# Patient Record
Sex: Female | Born: 1995 | Race: Black or African American | Hispanic: No | Marital: Single | Smoking: Former smoker
Health system: Southern US, Community
[De-identification: ages and names within clinical notes are randomized; demographics above are authoritative.]

## PROBLEM LIST (undated history)

## (undated) DIAGNOSIS — F419 Anxiety disorder, unspecified: Secondary | ICD-10-CM

## (undated) DIAGNOSIS — F410 Panic disorder [episodic paroxysmal anxiety] without agoraphobia: Secondary | ICD-10-CM

---
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
## 2017-12-05 ENCOUNTER — Encounter (HOSPITAL_COMMUNITY): Payer: Self-pay

## 2017-12-05 ENCOUNTER — Emergency Department (HOSPITAL_COMMUNITY)
Admission: EM | Admit: 2017-12-05 | Discharge: 2017-12-05 | Disposition: A | Discharge status: Home / Self Care | Payer: Self-pay | Attending: Emergency Medicine | Admitting: Emergency Medicine

## 2017-12-05 ENCOUNTER — Other Ambulatory Visit: Payer: Self-pay

## 2017-12-05 DIAGNOSIS — B349 Viral infection, unspecified: Secondary | ICD-10-CM | POA: Insufficient documentation

## 2017-12-05 DIAGNOSIS — F17210 Nicotine dependence, cigarettes, uncomplicated: Secondary | ICD-10-CM | POA: Insufficient documentation

## 2017-12-05 LAB — COMPREHENSIVE METABOLIC PANEL
ALT: 20 U/L (ref 0–44)
AST: 19 U/L (ref 15–41)
Albumin: 4.7 g/dL (ref 3.5–5.0)
Alkaline Phosphatase: 66 U/L (ref 38–126)
Anion gap: 10 (ref 5–15)
BUN: 9 mg/dL (ref 6–20)
CO2: 26 mmol/L (ref 22–32)
CREATININE: 0.66 mg/dL (ref 0.44–1.00)
Calcium: 9.5 mg/dL (ref 8.9–10.3)
Chloride: 104 mmol/L (ref 98–111)
GFR calc non Af Amer: >60 mL/min (ref >60)
Glucose, Bld: 96 mg/dL (ref 70–99)
Potassium: 3.7 mmol/L (ref 3.5–5.1)
SODIUM: 140 mmol/L (ref 135–145)
TOTAL PROTEIN: 8.3 g/dL — AB (ref 6.5–8.1)
Total Bilirubin: 0.8 mg/dL (ref 0.3–1.2)

## 2017-12-05 LAB — URINALYSIS, ROUTINE W REFLEX MICROSCOPIC
Bacteria, UA: NONE SEEN
Bilirubin Urine: NEGATIVE
Glucose, UA: NEGATIVE mg/dL
Ketones, ur: 5 mg/dL — AB
Leukocytes, UA: NEGATIVE
NITRITE: NEGATIVE
Protein, ur: NEGATIVE mg/dL
SPECIFIC GRAVITY, URINE: 1.017 (ref 1.005–1.030)
pH: 7 (ref 5.0–8.0)

## 2017-12-05 LAB — CBC
HCT: 41.5 % (ref 36.0–46.0)
Hemoglobin: 13.4 g/dL (ref 12.0–15.0)
MCH: 28.5 pg (ref 26.0–34.0)
MCHC: 32.3 g/dL (ref 30.0–36.0)
MCV: 88.1 fL (ref 78.0–100.0)
PLATELETS: 425 10*3/uL — AB (ref 150–400)
RBC: 4.71 MIL/uL (ref 3.87–5.11)
RDW: 13.5 % (ref 11.5–15.5)
WBC: 10.4 10*3/uL (ref 4.0–10.5)

## 2017-12-05 LAB — LIPASE, BLOOD: Lipase: 28 U/L (ref 11–51)

## 2017-12-05 LAB — I-STAT BETA HCG BLOOD, ED (MC, WL, AP ONLY): I-stat hCG, quantitative: <5 m[IU]/mL (ref <5)

## 2017-12-05 LAB — GROUP A STREP BY PCR: Group A Strep by PCR: NOT DETECTED

## 2017-12-05 MED ORDER — SODIUM CHLORIDE 0.9 % IV BOLUS
1000.0000 mL | Freq: Once | INTRAVENOUS | Status: AC
  Administered 2017-12-05: 1000 mL via INTRAVENOUS

## 2017-12-05 MED ORDER — ONDANSETRON HCL 4 MG/2ML IJ SOLN
4.0000 mg | Freq: Once | INTRAMUSCULAR | Status: AC
Start: 2017-12-05 — End: 2017-12-05
  Administered 2017-12-05: 4 mg via INTRAVENOUS
  Filled 2017-12-05: qty 2

## 2017-12-05 MED ORDER — KETOROLAC TROMETHAMINE 15 MG/ML IJ SOLN
15.0000 mg | Freq: Once | INTRAMUSCULAR | Status: AC
Start: 2017-12-05 — End: 2017-12-05
  Administered 2017-12-05: 15 mg via INTRAVENOUS
  Filled 2017-12-05: qty 1

## 2017-12-05 NOTE — ED Provider Notes (Signed)
[facility]-EMERGENCY DEPT Provider Note   CSN: [id number] Arrival date & time: 12/05/17  1445     History   Chief Complaint Chief Complaint  Patient presents with  . Emesis  . Chills  . Generalized Body Aches    HPI Yolanda Barnett is a 22 y.o. female.  Patient states that she has had fever, chills, sore throat, generalized body aches.  Currently on her menstrual cycle which causes her to feel nauseous at times.  Denies any cough, sputum production.  No urinary symptoms.  No vaginal discharge.  No abdominal pain.  Has taken Tylenol with some relief.  The history is provided by the patient.  Illness  This is a new problem. The current episode started 2 days ago. The problem occurs daily. The problem has not changed since onset.Pertinent negatives include no chest pain, no abdominal pain, no headaches and no shortness of breath. Nothing aggravates the symptoms. The symptoms are relieved by acetaminophen. She has tried acetaminophen for the symptoms. The treatment provided mild relief.    History reviewed. No pertinent past medical history.  There are no active problems to display for this patient.   History reviewed. No pertinent surgical history.   OB History   None      Home Medications    Prior to Admission medications   Medication Sig Start Date End Date Taking? Authorizing Provider  acetaminophen (TYLENOL) 500 MG tablet Take 1,000 mg by mouth 2 (two) times daily as needed for mild pain, fever or headache.   Yes [provider]  Pseudoeph-Doxylamine-DM-APAP (NYQUIL PO) Take 15 mLs by mouth at bedtime as needed (sleep).   Yes [provider]  cyclobenzaprine (FLEXERIL) 10 MG tablet Take 1 tablet (10 mg total) by mouth at bedtime as needed for muscle spasms. Patient not taking: Reported on 12/05/2017 11/04/16   Robinson, [place] N, PA-C  ondansetron (ZOFRAN ODT) 4 MG disintegrating tablet Take 1 tablet (4 mg total) by mouth every 8  (eight) hours as needed for nausea or vomiting. Patient not taking: Reported on 12/05/2017 04/08/14   Marcellina Millin, MD  traMADol (ULTRAM) 50 MG tablet Take 1 tablet (50 mg total) by mouth every 6 (six) hours as needed. Patient not taking: Reported on 12/05/2017 04/12/14   Jaynie Crumble, PA-C    Family History Family History  Problem Relation Age of Onset  . Cancer Mother   . Heart failure Mother     Social History Social History   Tobacco Use  . Smoking status: Current Every Day Smoker    Packs/day: 0.15    Types: Cigarettes  . Smokeless tobacco: Never Used  Substance Use Topics  . Alcohol use: No  . Drug use: No     Allergies   Penicillins   Review of Systems Review of Systems  Constitutional: Negative for chills and fever.  HENT: Positive for sore throat. Negative for congestion, dental problem, ear pain, postnasal drip, sinus pressure, sinus pain, trouble swallowing and voice change.   Eyes: Negative for pain and visual disturbance.  Respiratory: Negative for cough and shortness of breath.   Cardiovascular: Negative for chest pain and palpitations.  Gastrointestinal: Positive for nausea. Negative for abdominal pain, constipation, diarrhea and vomiting.  Genitourinary: Negative for dysuria and hematuria.  Musculoskeletal: Positive for myalgias. Negative for arthralgias and back pain.  Skin: Negative for color change and rash.  Neurological: Negative for seizures, syncope and headaches.  All other systems reviewed and are negative.  Physical Exam Updated Vital Signs  ED Triage Vitals  Enc Vitals Group     BP 12/05/17 1507 (!) 138/92     Pulse Rate 12/05/17 1507 (!) 106     Resp 12/05/17 1507 16     Temp 12/05/17 1507 100 F (37.8 C)     Temp Source 12/05/17 1507 Oral     SpO2 12/05/17 1507 100 %     Weight 12/05/17 1510 188 lb (85.3 kg)     Height 12/05/17 1510 5\' 3"  (1.6 m)     Head Circumference --      Peak Flow --      Pain Score 12/05/17 1509  7     Pain Loc --      Pain Edu? --      Excl. in GC? --     Physical Exam  Constitutional: She is oriented to person, place, and time. She appears well-developed and well-nourished. No distress.  HENT:  Head: Normocephalic and atraumatic.  Mouth/Throat: Oropharyngeal exudate present.  Eyes: Pupils are equal, round, and reactive to light. Conjunctivae and EOM are normal.  Neck: Normal range of motion. Neck supple.  Cardiovascular: Normal rate, regular rhythm, normal heart sounds and intact distal pulses.  No murmur heard. Pulmonary/Chest: Effort normal and breath sounds normal. No respiratory distress.  Abdominal: Soft. Bowel sounds are normal. She exhibits no distension. There is no tenderness.  Musculoskeletal: Normal range of motion. She exhibits no edema.  Neurological: She is alert and oriented to person, place, and time.  Skin: Skin is warm and dry. Capillary refill takes less than 2 seconds.  Psychiatric: She has a normal mood and affect.  Nursing note and vitals reviewed.    ED Treatments / Results  Labs (all labs ordered are listed, but only abnormal results are displayed) Labs Reviewed  COMPREHENSIVE METABOLIC PANEL - Abnormal; Notable for the following components:      Result Value   Total Protein 8.3 (*)    All other components within normal limits  CBC - Abnormal; Notable for the following components:   Platelets 425 (*)    All other components within normal limits  URINALYSIS, ROUTINE W REFLEX MICROSCOPIC - Abnormal; Notable for the following components:   Hgb urine dipstick MODERATE (*)    Ketones, ur 5 (*)    All other components within normal limits  GROUP A STREP BY PCR  LIPASE, BLOOD  I-STAT BETA HCG BLOOD, ED ([facility], [facility], [facility] ONLY)    EKG None  Radiology No results found.  Procedures Procedures (including critical care time)  Medications Ordered in ED Medications  sodium chloride 0.9 % bolus 1,000 mL (0 mLs Intravenous Stopped 12/05/17 2032)    ketorolac (TORADOL) 15 MG/ML injection 15 mg (15 mg Intravenous Given 12/05/17 1925)  ondansetron (ZOFRAN) injection 4 mg (4 mg Intravenous Given 12/05/17 1925)     Initial Impression / Assessment and Plan / ED Course  I have reviewed the triage vital signs and the nursing notes.  Pertinent labs & imaging results that were available during my care of the patient were reviewed by me and considered in my medical decision making (see chart for details).     Armeniahina Peterson LombardQ Habeeb is a 22 year old female w/ no significant medical history who presents to the ED with body aches, sore throat, fever.  Patient with mild tachycardia and low-grade temperature upon arrival.  Otherwise normal vitals.  Patient denies any urinary symptoms, cough, sputum production.  Patient has had mostly  upper respiratory symptoms with sore throat.  Patient has no tenderness on abdominal exam.  Clear breath sounds bilaterally.  She has some exudates and redness in the posterior pharynx.  Suspect possible pharyngitis or postnasal drip.  Patient with no signs of urinary tract infection on exam.  Strep screen is negative.  Pregnancy test is negative.  Patient with no significant leukocytosis, electrolyte abnormality, kidney injury.  Lipase within normal limits doubt pancreatitis.  No cough, no sputum production, clear breath sounds, no shortness of breath, doubt pneumonia.  Suspect patient likely with viral syndrome.  Told her return to the ED if she develops cough, sputum production, worsening symptoms.  No concern for intra-abdominal process at this time but also given strict return precautions.   Felt better after IV fluids and Toradol and Zofran.  Recommend follow-up with primary care doctor and discharged from ED in good condition.  This chart was dictated using voice recognition software.  Despite best efforts to proofread,  errors can occur which can change the documentation meaning.   Final Clinical Impressions(s) / ED Diagnoses    Final diagnoses:  Viral syndrome    ED Discharge Orders    None       Virgina Norfolk, DO 12/05/17 2332

## 2017-12-05 NOTE — ED Triage Notes (Addendum)
Patient c/o vomiting, chills, headache, and body aches since yesterday.

## 2017-12-05 NOTE — ED Notes (Signed)
Pt informed of need for urine sample. Instructed to let staff know when she is able to provide sample

## 2018-02-06 ENCOUNTER — Encounter (HOSPITAL_COMMUNITY): Payer: Self-pay | Admitting: Emergency Medicine

## 2018-02-06 ENCOUNTER — Emergency Department (HOSPITAL_COMMUNITY)
Admission: EM | Admit: 2018-02-06 | Discharge: 2018-02-06 | Disposition: A | Discharge status: Home / Self Care | Payer: Medicaid Other | Attending: Emergency Medicine | Admitting: Emergency Medicine

## 2018-02-06 DIAGNOSIS — R51 Headache: Secondary | ICD-10-CM | POA: Insufficient documentation

## 2018-02-06 DIAGNOSIS — R112 Nausea with vomiting, unspecified: Secondary | ICD-10-CM

## 2018-02-06 DIAGNOSIS — J101 Influenza due to other identified influenza virus with other respiratory manifestations: Secondary | ICD-10-CM | POA: Insufficient documentation

## 2018-02-06 DIAGNOSIS — Z79899 Other long term (current) drug therapy: Secondary | ICD-10-CM | POA: Insufficient documentation

## 2018-02-06 DIAGNOSIS — J111 Influenza due to unidentified influenza virus with other respiratory manifestations: Secondary | ICD-10-CM

## 2018-02-06 DIAGNOSIS — R519 Headache, unspecified: Secondary | ICD-10-CM

## 2018-02-06 DIAGNOSIS — F17210 Nicotine dependence, cigarettes, uncomplicated: Secondary | ICD-10-CM | POA: Insufficient documentation

## 2018-02-06 DIAGNOSIS — R69 Illness, unspecified: Secondary | ICD-10-CM

## 2018-02-06 LAB — CBC WITH DIFFERENTIAL/PLATELET
ABS IMMATURE GRANULOCYTES: 0.01 10*3/uL (ref 0.00–0.07)
Basophils Absolute: 0 10*3/uL (ref 0.0–0.1)
Basophils Relative: 1 %
EOS ABS: 0.3 10*3/uL (ref 0.0–0.5)
Eosinophils Relative: 4 %
HEMATOCRIT: 42.5 % (ref 36.0–46.0)
Hemoglobin: 13 g/dL (ref 12.0–15.0)
IMMATURE GRANULOCYTES: 0 %
Lymphocytes Relative: 45 %
Lymphs Abs: 2.5 10*3/uL (ref 0.7–4.0)
MCH: 26.7 pg (ref 26.0–34.0)
MCHC: 30.6 g/dL (ref 30.0–36.0)
MCV: 87.4 fL (ref 80.0–100.0)
MONOS PCT: 10 %
Monocytes Absolute: 0.6 10*3/uL (ref 0.1–1.0)
NEUTROS PCT: 40 %
Neutro Abs: 2.3 10*3/uL (ref 1.7–7.7)
Platelets: 438 10*3/uL — ABNORMAL HIGH (ref 150–400)
RBC: 4.86 MIL/uL (ref 3.87–5.11)
RDW: 12.7 % (ref 11.5–15.5)
WBC: 5.7 10*3/uL (ref 4.0–10.5)
nRBC: 0 % (ref 0.0–0.2)

## 2018-02-06 LAB — COMPREHENSIVE METABOLIC PANEL
ALT: 15 U/L (ref 0–44)
AST: 18 U/L (ref 15–41)
Albumin: 4.1 g/dL (ref 3.5–5.0)
Alkaline Phosphatase: 60 U/L (ref 38–126)
Anion gap: 9 (ref 5–15)
BILIRUBIN TOTAL: 0.5 mg/dL (ref 0.3–1.2)
BUN: 6 mg/dL (ref 6–20)
CO2: 26 mmol/L (ref 22–32)
CREATININE: 0.65 mg/dL (ref 0.44–1.00)
Calcium: 9.5 mg/dL (ref 8.9–10.3)
Chloride: 104 mmol/L (ref 98–111)
GFR calc Af Amer: >60 mL/min (ref >60)
GLUCOSE: 102 mg/dL — AB (ref 70–99)
Potassium: 3.9 mmol/L (ref 3.5–5.1)
Sodium: 139 mmol/L (ref 135–145)
TOTAL PROTEIN: 7.4 g/dL (ref 6.5–8.1)

## 2018-02-06 LAB — I-STAT BETA HCG BLOOD, ED (MC, WL, AP ONLY): I-stat hCG, quantitative: <5 m[IU]/mL (ref <5)

## 2018-02-06 LAB — INFLUENZA PANEL BY PCR (TYPE A & B)
Influenza A By PCR: NEGATIVE
Influenza B By PCR: NEGATIVE

## 2018-02-06 LAB — LIPASE, BLOOD: LIPASE: 25 U/L (ref 11–51)

## 2018-02-06 MED ORDER — KETOROLAC TROMETHAMINE 30 MG/ML IJ SOLN
30.0000 mg | Freq: Once | INTRAMUSCULAR | Status: AC
  Administered 2018-02-06: 30 mg via INTRAVENOUS
  Filled 2018-02-06: qty 1

## 2018-02-06 MED ORDER — ONDANSETRON 4 MG PO TBDP: ORAL_TABLET | ORAL | 0 refills | Status: DC

## 2018-02-06 MED ORDER — SODIUM CHLORIDE 0.9 % IV BOLUS
1000.0000 mL | Freq: Once | INTRAVENOUS | Status: AC
  Administered 2018-02-06: 1000 mL via INTRAVENOUS

## 2018-02-06 MED ORDER — ONDANSETRON HCL 4 MG/2ML IJ SOLN
4.0000 mg | Freq: Once | INTRAMUSCULAR | Status: AC
  Administered 2018-02-06: 4 mg via INTRAVENOUS
  Filled 2018-02-06: qty 2

## 2018-02-06 NOTE — ED Triage Notes (Signed)
Pt c/o HA x several days with n/v onset yesterday. Emesis x 10 in last 24 hours. +photophobia denies diarrhea

## 2018-02-06 NOTE — ED Provider Notes (Signed)
[facility] EMERGENCY DEPARTMENT Provider Note   CSN: [id number] Arrival date & time: 02/06/18  8657     History   Chief Complaint Chief Complaint  Patient presents with  . Emesis    HPI Yolanda Barnett is a 22 y.o. female with a hx of no major medical problems presents to the Emergency Department complaining of gradual, persistent, progressively worsening right-sided headache onset approximately 3 days ago.  Patient reports associated URI symptoms including nasal congestion, postnasal drip, sore throat and cough onset around the same time.  She reports that over the last several days headache has become generalized and throbbing in nature.  Today she developed nausea and vomiting.  She reports 10 bouts of nonbloody and nonbilious emesis in the last 24 hours.  Her last episode of emesis was approximately 2 hours ago.  She denies diarrhea, melena or hematochezia.  No recent international travel.  Patient does report her girlfriend is sick with similar URI symptoms but has not been vomiting.  Patient reports she is currently menstruating.  She reports some lower abdominal cramping associated with her menstrual cycle but not out of the ordinary.  She denies fever, chills, neck pain, neck stiffness, chest pain, shortness of breath, weakness, dizziness, syncope.  No treatments prior to arrival.  No specific aggravating or alleviating factors.  The history is provided by the patient, medical records and a significant other. No language interpreter was used.    History reviewed. No pertinent past medical history.  There are no active problems to display for this patient.   History reviewed. No pertinent surgical history.   OB History   None      Home Medications    Prior to Admission medications   Medication Sig Start Date End Date Taking? Authorizing Provider  acetaminophen (TYLENOL) 500 MG tablet Take 1,000 mg by mouth 2 (two) times daily as needed for mild pain,  fever or headache.   Yes [provider]  Pseudoeph-Doxylamine-DM-APAP (NYQUIL PO) Take 15 mLs by mouth at bedtime as needed (sleep).   Yes [provider]  ondansetron (ZOFRAN ODT) 4 MG disintegrating tablet 4mg  ODT q4 hours prn nausea/vomit 02/06/18   Abijah Roussel, Dahlia Client, PA-C    Family History Family History  Problem Relation Age of Onset  . Cancer Mother   . Heart failure Mother     Social History Social History   Tobacco Use  . Smoking status: Current Every Day Smoker    Packs/day: 0.15    Types: Cigarettes  . Smokeless tobacco: Never Used  Substance Use Topics  . Alcohol use: No  . Drug use: No     Allergies   Penicillins   Review of Systems Review of Systems  Constitutional: Negative for appetite change, diaphoresis, fatigue, fever and unexpected weight change.  HENT: Positive for congestion, postnasal drip, rhinorrhea, sinus pressure and sore throat. Negative for mouth sores.   Eyes: Negative for visual disturbance.  Respiratory: Negative for cough, chest tightness, shortness of breath and wheezing.   Cardiovascular: Negative for chest pain.  Gastrointestinal: Positive for abdominal pain, nausea and vomiting. Negative for constipation and diarrhea.  Endocrine: Negative for polydipsia, polyphagia and polyuria.  Genitourinary: Negative for dysuria, frequency, hematuria and urgency.  Musculoskeletal: Negative for back pain and neck stiffness.  Skin: Negative for rash.  Allergic/Immunologic: Negative for immunocompromised state.  Neurological: Positive for headaches. Negative for syncope and light-headedness.  Hematological: Does not bruise/bleed easily.  Psychiatric/Behavioral: Negative for sleep disturbance. The patient is  not nervous/anxious.      Physical Exam Updated Vital Signs BP 132/89 (BP Location: Left Arm)   Pulse (!) 105   Temp (!) 97.5 F (36.4 C) (Oral)   Resp 18   SpO2 100%   Physical Exam  Constitutional: She is  oriented to person, place, and time. She appears well-developed and well-nourished. No distress.  HENT:  Head: Normocephalic and atraumatic.  Right Ear: Tympanic membrane, external ear and ear canal normal.  Left Ear: Tympanic membrane, external ear and ear canal normal.  Nose: Mucosal edema and rhinorrhea present. No epistaxis. Right sinus exhibits no maxillary sinus tenderness and no frontal sinus tenderness. Left sinus exhibits no maxillary sinus tenderness and no frontal sinus tenderness.  Mouth/Throat: Uvula is midline, oropharynx is clear and moist and mucous membranes are normal. Mucous membranes are not pale and not cyanotic. No oropharyngeal exudate, posterior oropharyngeal edema, posterior oropharyngeal erythema or tonsillar abscesses.  Eyes: Pupils are equal, round, and reactive to light. Conjunctivae and EOM are normal. No scleral icterus.  No horizontal, vertical or rotational nystagmus  Neck: Normal range of motion and full passive range of motion without pain. Neck supple.  Full active and passive ROM without pain No midline or paraspinal tenderness No nuchal rigidity or meningeal signs  Cardiovascular: Regular rhythm and intact distal pulses. Tachycardia present.  Pulses:      Radial pulses are 2+ on the right side, and 2+ on the left side.       Posterior tibial pulses are 2+ on the right side, and 2+ on the left side.  Tachycardia noted  Pulmonary/Chest: Effort normal and breath sounds normal. No stridor. No respiratory distress. She has no wheezes. She has no rales.  Clear and equal breath sounds without focal wheezes, rhonchi, rales  Abdominal: Soft. Bowel sounds are normal. There is no tenderness. There is no rigidity, no rebound, no guarding and no CVA tenderness.  Soft and nontender  Musculoskeletal: Normal range of motion.  Lymphadenopathy:    She has no cervical adenopathy.  Neurological: She is alert and oriented to person, place, and time. No cranial nerve deficit.  She exhibits normal muscle tone. Coordination normal.  Mental Status:  Alert, oriented, thought content appropriate. Speech fluent without evidence of aphasia. Able to follow 2 step commands without difficulty.  Cranial Nerves:  II:  Peripheral visual fields grossly normal, pupils equal, round, reactive to light III,IV, VI: ptosis not present, extra-ocular motions intact bilaterally  V,VII: smile symmetric, facial light touch sensation equal VIII: hearing grossly normal bilaterally  IX,X: midline uvula rise  XI: bilateral shoulder shrug equal and strong XII: midline tongue extension  Motor:  5/5 in upper and lower extremities bilaterally including strong and equal grip strength and dorsiflexion/plantar flexion Sensory: Pinprick and light touch normal in all extremities.  Cerebellar: normal finger-to-nose with bilateral upper extremities Gait: normal gait and balance CV: distal pulses palpable throughout   Skin: Skin is warm and dry. No rash noted. She is not diaphoretic.  Psychiatric: She has a normal mood and affect. Her behavior is normal. Judgment and thought content normal.  Nursing note and vitals reviewed.    ED Treatments / Results  Labs (all labs ordered are listed, but only abnormal results are displayed) Labs Reviewed  CBC WITH DIFFERENTIAL/PLATELET - Abnormal; Notable for the following components:      Result Value   Platelets 438 (*)    All other components within normal limits  COMPREHENSIVE METABOLIC PANEL - Abnormal; Notable for  the following components:   Glucose, Bld 102 (*)    All other components within normal limits  LIPASE, BLOOD  INFLUENZA PANEL BY PCR (TYPE A & B)  I-STAT BETA HCG BLOOD, ED ([facility], [facility], [facility] ONLY)     Procedures Procedures (including critical care time)  Medications Ordered in ED Medications  sodium chloride 0.9 % bolus 1,000 mL (1,000 mLs Intravenous New Bag/Given 02/06/18 0555)  sodium chloride 0.9 % bolus 1,000 mL (0 mLs Intravenous  Stopped 02/06/18 0513)  ondansetron (ZOFRAN) injection 4 mg (4 mg Intravenous Given 02/06/18 0359)  ketorolac (TORADOL) 30 MG/ML injection 30 mg (30 mg Intravenous Given 02/06/18 0359)     Initial Impression / Assessment and Plan / ED Course  I have reviewed the triage vital signs and the nursing notes.  Pertinent labs & imaging results that were available during my care of the patient were reviewed by me and considered in my medical decision making (see chart for details).  Clinical Course as of Feb 06 601  Tue Feb 06, 2018  0501 She reports she feels significantly better.  Rate has improved.  Heart rate has also improved.   [HM]  0502 Abdomen remained soft and nontender on repeat exam   [HM]  0558 Pt continues to feel better.  Pt's gross neuro exam remains unchanged.  No additional vomiting at this time.  Abd remains soft and nontender.     [HM]    Clinical Course User Index [HM] Teighlor Korson, Boyd Kerbs    Presents with influenza-like illness.  She has URI symptoms and developed nausea and vomiting today.  Abdomen is soft and nontender.  Normal neurologic exam.  Suspect headache is secondary to URI symptoms and vomiting.  No clinical signs of meningitis.  Patient is afebrile without nuchal rigidity.  No petechiae or purpura.  Zofran given.  Labs reassuring.  Fluids given.  Patient's tachycardia has improved.  She is well-appearing and gross neurologic exam remains unchanged.  Highly doubt subarachnoid hemorrhage.  Patient denies falls or known trauma.  Conservative therapy discussed with patient and significant other.  Also discussed reasons to return immediately to the emergency department.  Patient states understanding and is in agreement with the plan.  Final Clinical Impressions(s) / ED Diagnoses   Final diagnoses:  Influenza-like illness  Nausea and vomiting, intractability of vomiting not specified, unspecified vomiting type  Generalized headache    ED Discharge Orders           Ordered    ondansetron (ZOFRAN ODT) 4 MG disintegrating tablet     02/06/18 0601           Dayjah Selman, Dahlia Client, PA-C 02/06/18 0603    Dione Booze, MD 02/06/18 2247

## 2018-02-06 NOTE — ED Notes (Signed)
Pt received discharge paperwork. Denies needs or concerns at this time. Denies any further questions

## 2018-02-06 NOTE — ED Notes (Signed)
Pt unable to give urine sample at this time 

## 2018-02-06 NOTE — Discharge Instructions (Addendum)
1. Medications: zofran, usual home medications °2. Treatment: rest, drink plenty of fluids, advance diet slowly °3. Follow Up: Please followup with your primary doctor in 2 days for discussion of your diagnoses and further evaluation after today's visit; if you do not have a primary care doctor use the resource guide provided to find one; Please return to the ER for persistent vomiting, high fevers or worsening symptoms ° °

## 2018-05-12 ENCOUNTER — Other Ambulatory Visit: Payer: Self-pay

## 2018-05-12 ENCOUNTER — Emergency Department (HOSPITAL_COMMUNITY)
Admission: EM | Admit: 2018-05-12 | Discharge: 2018-05-12 | Disposition: A | Discharge status: Home / Self Care | Payer: Medicaid Other | Attending: Emergency Medicine | Admitting: Emergency Medicine

## 2018-05-12 ENCOUNTER — Encounter (HOSPITAL_COMMUNITY): Payer: Self-pay | Admitting: Emergency Medicine

## 2018-05-12 DIAGNOSIS — R112 Nausea with vomiting, unspecified: Secondary | ICD-10-CM | POA: Insufficient documentation

## 2018-05-12 DIAGNOSIS — F17210 Nicotine dependence, cigarettes, uncomplicated: Secondary | ICD-10-CM | POA: Insufficient documentation

## 2018-05-12 LAB — I-STAT BETA HCG BLOOD, ED (MC, WL, AP ONLY): I-stat hCG, quantitative: <5 m[IU]/mL (ref <5)

## 2018-05-12 MED ORDER — ONDANSETRON HCL 4 MG/2ML IJ SOLN: 4.0000 mg | Freq: Once | INTRAMUSCULAR | Status: DC

## 2018-05-12 MED ORDER — ONDANSETRON HCL 4 MG/2ML IJ SOLN: 4.0000 mg | Freq: Once | INTRAMUSCULAR | Status: DC | PRN

## 2018-05-12 MED ORDER — PROMETHAZINE HCL 25 MG PO TABS
25.0000 mg | ORAL_TABLET | Freq: Once | ORAL | Status: AC
  Administered 2018-05-12: 25 mg via ORAL
  Filled 2018-05-12: qty 1

## 2018-05-12 MED ORDER — SODIUM CHLORIDE 0.9% FLUSH: 3.0000 mL | Freq: Once | INTRAVENOUS | Status: DC

## 2018-05-12 MED ORDER — ONDANSETRON 8 MG PO TBDP: 8.0000 mg | ORAL_TABLET | Freq: Three times a day (TID) | ORAL | 0 refills | Status: DC | PRN

## 2018-05-12 MED ORDER — ONDANSETRON 8 MG PO TBDP
8.0000 mg | ORAL_TABLET | Freq: Once | ORAL | Status: AC
  Administered 2018-05-12: 8 mg via ORAL
  Filled 2018-05-12: qty 1

## 2018-05-12 MED ORDER — SODIUM CHLORIDE 0.9 % IV BOLUS: 1000.0000 mL | Freq: Once | INTRAVENOUS | Status: DC

## 2018-05-12 NOTE — ED Triage Notes (Signed)
Patient is complaining of pain underneath right breast and vomiting. Patient states that it hurts to laugh or cough. The pain underneath breast started 2 weeks ago. The vomiting started today.

## 2018-05-12 NOTE — ED Provider Notes (Signed)
[facility]-EMERGENCY DEPT Provider Note   CSN: [id number] Arrival date & time: 05/12/18  2240    History   Chief Complaint Chief Complaint  Patient presents with  . Vomiting    HPI Yolanda Barnett is a 23 y.o. female.     HPI 23 year old female presents today complaining of nausea and vomiting.  She states that she has had some right-sided flank pain intermittently for the past week and a half.  She denies any UTI symptoms, frequency, or hematuria.  Days she had nausea and several episodes of vomiting.  This was not associated with any pain.  She is otherwise been eating and drinking well.  She denies fever or diarrhea.  First day of her last menstrual period was 1 week ago today.  That was abnormal.  Had a normal time.  She has been having protected intercourse. History reviewed. No pertinent past medical history.  There are no active problems to display for this patient.   History reviewed. No pertinent surgical history.   OB History   No obstetric history on file.      Home Medications    Prior to Admission medications   Medication Sig Start Date End Date Taking? Authorizing Provider  acetaminophen (TYLENOL) 500 MG tablet Take 1,000 mg by mouth 2 (two) times daily as needed for mild pain, fever or headache.    [provider]  ondansetron (ZOFRAN ODT) 4 MG disintegrating tablet 4mg  ODT q4 hours prn nausea/vomit 02/06/18   Muthersbaugh, Dahlia Client, PA-C  Pseudoeph-Doxylamine-DM-APAP (NYQUIL PO) Take 15 mLs by mouth at bedtime as needed (sleep).    [provider]    Family History Family History  Problem Relation Age of Onset  . Cancer Mother   . Heart failure Mother     Social History Social History   Tobacco Use  . Smoking status: Current Every Day Smoker    Packs/day: 0.15    Types: Cigarettes  . Smokeless tobacco: Never Used  Substance Use Topics  . Alcohol use: No  . Drug use: No     Allergies    Penicillins   Review of Systems Review of Systems  All other systems reviewed and are negative.    Physical Exam Updated Vital Signs BP 135/85 (BP Location: Left Arm)   Pulse (!) 107   Temp 98.4 F (36.9 C) (Oral)   Resp 18   Ht 1.575 m (5\' 2" )   Wt 85.7 kg   LMP 05/10/2018   SpO2 100%   BMI 34.57 kg/m   Physical Exam Vitals signs and nursing note reviewed.  Constitutional:      Appearance: Normal appearance.  HENT:     Head: Normocephalic.     Right Ear: External ear normal.     Left Ear: External ear normal.     Nose: Nose normal.     Mouth/Throat:     Mouth: Mucous membranes are moist.  Eyes:     Pupils: Pupils are equal, round, and reactive to light.  Neck:     Musculoskeletal: Normal range of motion.  Cardiovascular:     Rate and Rhythm: Normal rate and regular rhythm.  Pulmonary:     Effort: Pulmonary effort is normal.     Breath sounds: Normal breath sounds.  Abdominal:     General: Abdomen is flat.     Tenderness: There is no abdominal tenderness.  Musculoskeletal: Normal range of motion.  Skin:    General: Skin is warm.  Capillary Refill: Capillary refill takes less than 2 seconds.  Neurological:     General: No focal deficit present.     Mental Status: She is alert and oriented to person, place, and time.  Psychiatric:        Mood and Affect: Mood normal.      ED Treatments / Results  Labs (all labs ordered are listed, but only abnormal results are displayed) Labs Reviewed  LIPASE, BLOOD  COMPREHENSIVE METABOLIC PANEL  CBC  URINALYSIS, ROUTINE W REFLEX MICROSCOPIC  I-STAT BETA HCG BLOOD, ED ([facility], [facility], [facility] ONLY)    EKG None  Radiology No results found.  Procedures Procedures (including critical care time)  Medications Ordered in ED Medications  sodium chloride flush (NS) 0.9 % injection 3 mL (has no administration in time range)  ondansetron (ZOFRAN) injection 4 mg (has no administration in time range)  sodium chloride 0.9  % bolus 1,000 mL (has no administration in time range)  ondansetron (ZOFRAN) injection 4 mg (has no administration in time range)     Initial Impression / Assessment and Plan / ED Course  I have reviewed the triage vital signs and the nursing notes.  Pertinent labs & imaging results that were available during my care of the patient were reviewed by me and considered in my medical decision making (see chart for details).        Patient given zofran Plan po challenge   Final Clinical Impressions(s) / ED Diagnoses   Final diagnoses:  Non-intractable vomiting with nausea, unspecified vomiting type    ED Discharge Orders    None       Margarita Grizzle, MD 05/12/18 2335

## 2019-07-18 ENCOUNTER — Telehealth: Payer: Self-pay | Admitting: Nurse Practitioner

## 2019-07-18 NOTE — Telephone Encounter (Signed)
Called Pt to remind of appointment the Number is not a working number

## 2019-07-19 ENCOUNTER — Ambulatory Visit: Payer: Medicaid Other | Admitting: Nurse Practitioner

## 2019-10-21 ENCOUNTER — Ambulatory Visit
Admission: EM | Admit: 2019-10-21 | Discharge: 2019-10-21 | Disposition: A | Discharge status: Home / Self Care | Payer: PRIVATE HEALTH INSURANCE | Attending: Emergency Medicine | Admitting: Emergency Medicine

## 2019-10-21 ENCOUNTER — Other Ambulatory Visit: Payer: Self-pay

## 2019-10-21 DIAGNOSIS — Z1152 Encounter for screening for COVID-19: Secondary | ICD-10-CM

## 2019-10-21 DIAGNOSIS — J069 Acute upper respiratory infection, unspecified: Secondary | ICD-10-CM

## 2019-10-21 MED ORDER — CETIRIZINE HCL 10 MG PO TABS: 10.0000 mg | ORAL_TABLET | Freq: Every day | ORAL | 0 refills | Status: AC

## 2019-10-21 MED ORDER — FLUTICASONE PROPIONATE 50 MCG/ACT NA SUSP: 1.0000 | Freq: Every day | NASAL | 0 refills | Status: AC

## 2019-10-21 MED ORDER — BENZONATATE 100 MG PO CAPS: 100.0000 mg | ORAL_CAPSULE | Freq: Three times a day (TID) | ORAL | 0 refills | Status: AC

## 2019-10-21 NOTE — ED Triage Notes (Signed)
Pt c/o cough, sore throat, nausea, and dizzy since last Thursday. States had a positive covid exposure a week ago.

## 2019-10-21 NOTE — ED Provider Notes (Signed)
[facility]    CSN: [id number] Arrival date & time: 10/21/19  0919      History   Chief Complaint Chief Complaint  Patient presents with  . Cough    HPI Yolanda Barnett is a 24 y.o. female   Presenting for Covid testing: Exposure: coworker Date of exposure: last week Any fever, symptoms since exposure: mildy productive cough, sore throat, malaise.  Feels sx are mild.  No fever, SOB, CP.   History reviewed. No pertinent past medical history.  There are no problems to display for this patient.   History reviewed. No pertinent surgical history.  OB History   No obstetric history on file.      Home Medications    Prior to Admission medications   Medication Sig Start Date End Date Taking? Authorizing Provider  benzonatate (TESSALON) 100 MG capsule Take 1 capsule (100 mg total) by mouth every 8 (eight) hours. 10/21/19   Hall-Potvin, [place], PA-C  cetirizine (ZYRTEC ALLERGY) 10 MG tablet Take 1 tablet (10 mg total) by mouth daily. 10/21/19   Hall-Potvin, [place], PA-C  fluticasone (FLONASE) 50 MCG/ACT nasal spray Place 1 spray into both nostrils daily. 10/21/19   Hall-Potvin, [place], PA-C    Family History Family History  Problem Relation Age of Onset  . Cancer Mother   . Heart failure Mother     Social History Social History   Tobacco Use  . Smoking status: Current Every Day Smoker    Packs/day: 0.15    Types: Cigarettes  . Smokeless tobacco: Never Used  Vaping Use  . Vaping Use: Never used  Substance Use Topics  . Alcohol use: Yes  . Drug use: No     Allergies   Penicillins   Review of Systems As per HPI   Physical Exam Triage Vital Signs ED Triage Vitals  Enc Vitals Group     BP      Pulse      Resp      Temp      Temp src      SpO2      Weight      Height      Head Circumference      Peak Flow      Pain Score      Pain Loc      Pain Edu?      Excl. in GC?    No data found.  Updated Vital Signs BP (!) 141/88 (BP  Location: Left Arm)   Pulse 83   Temp 98.8 F (37.1 C) (Oral)   Resp 18   LMP 10/19/2019   SpO2 98%   Visual Acuity Right Eye Distance:   Left Eye Distance:   Bilateral Distance:    Right Eye Near:   Left Eye Near:    Bilateral Near:     Physical Exam Constitutional:      General: She is not in acute distress.    Appearance: She is obese. She is not ill-appearing or diaphoretic.  HENT:     Head: Normocephalic and atraumatic.     Mouth/Throat:     Mouth: Mucous membranes are moist.     Pharynx: Oropharynx is clear. No oropharyngeal exudate or posterior oropharyngeal erythema.  Eyes:     General: No scleral icterus.    Conjunctiva/sclera: Conjunctivae normal.     Pupils: Pupils are equal, round, and reactive to light.  Neck:     Comments: Trachea midline, negative JVD Cardiovascular:  Rate and Rhythm: Normal rate and regular rhythm.     Heart sounds: No murmur heard.  No gallop.   Pulmonary:     Effort: Pulmonary effort is normal. No respiratory distress.     Breath sounds: No wheezing, rhonchi or rales.  Musculoskeletal:     Cervical back: Neck supple. No tenderness.  Lymphadenopathy:     Cervical: No cervical adenopathy.  Skin:    Capillary Refill: Capillary refill takes less than 2 seconds.     Coloration: Skin is not jaundiced or pale.     Findings: No rash.  Neurological:     General: No focal deficit present.     Mental Status: She is alert and oriented to person, place, and time.      UC Treatments / Results  Labs (all labs ordered are listed, but only abnormal results are displayed) Labs Reviewed  NOVEL CORONAVIRUS, NAA    EKG   Radiology No results found.  Procedures Procedures (including critical care time)  Medications Ordered in UC Medications - No data to display  Initial Impression / Assessment and Plan / UC Course  I have reviewed the triage vital signs and the nursing notes.  Pertinent labs & imaging results that were  available during my care of the patient were reviewed by me and considered in my medical decision making (see chart for details).     Patient afebrile, nontoxic, with SpO2 98%.  Covid PCR pending.  Patient to quarantine until results are back.  We will treat supportively as outlined below.  Return precautions discussed, patient verbalized understanding and is agreeable to plan. Final Clinical Impressions(s) / UC Diagnoses   Final diagnoses:  Encounter for screening for COVID-19  Viral URI with cough     Discharge Instructions     Tessalon for cough. Start flonase, atrovent nasal spray for nasal congestion/drainage. You can use over the counter nasal saline rinse such as neti pot for nasal congestion. Keep hydrated, your urine should be clear to pale yellow in color. Tylenol/motrin for fever and pain. Monitor for any worsening of symptoms, chest pain, shortness of breath, wheezing, swelling of the throat, go to the emergency department for further evaluation needed.     ED Prescriptions    Medication Sig Dispense Auth. Provider   benzonatate (TESSALON) 100 MG capsule Take 1 capsule (100 mg total) by mouth every 8 (eight) hours. 21 capsule Hall-Potvin, [place], PA-C   fluticasone (FLONASE) 50 MCG/ACT nasal spray Place 1 spray into both nostrils daily. 16 g Hall-Potvin, [place], PA-C   cetirizine (ZYRTEC ALLERGY) 10 MG tablet Take 1 tablet (10 mg total) by mouth daily. 30 tablet Hall-Potvin, [place], PA-C     PDMP not reviewed this encounter.   Hall-Potvin, [place], [place] 10/21/19 1017

## 2019-10-21 NOTE — Discharge Instructions (Addendum)

## 2019-10-22 LAB — SARS-COV-2, NAA 2 DAY TAT

## 2019-10-22 LAB — NOVEL CORONAVIRUS, NAA: SARS-CoV-2, NAA: NOT DETECTED

## 2020-01-20 ENCOUNTER — Encounter (HOSPITAL_COMMUNITY): Payer: Self-pay | Admitting: Obstetrics and Gynecology

## 2020-01-20 ENCOUNTER — Emergency Department (HOSPITAL_COMMUNITY)
Admission: EM | Admit: 2020-01-20 | Discharge: 2020-01-20 | Disposition: A | Discharge status: Home / Self Care | Payer: PRIVATE HEALTH INSURANCE | Attending: Emergency Medicine | Admitting: Emergency Medicine

## 2020-01-20 ENCOUNTER — Other Ambulatory Visit: Payer: Self-pay

## 2020-01-20 DIAGNOSIS — F419 Anxiety disorder, unspecified: Secondary | ICD-10-CM | POA: Insufficient documentation

## 2020-01-20 DIAGNOSIS — Z5321 Procedure and treatment not carried out due to patient leaving prior to being seen by health care provider: Secondary | ICD-10-CM | POA: Insufficient documentation

## 2020-01-20 DIAGNOSIS — R42 Dizziness and giddiness: Secondary | ICD-10-CM | POA: Insufficient documentation

## 2020-01-20 HISTORY — DX: Panic disorder (episodic paroxysmal anxiety): F41.0

## 2020-01-20 HISTORY — DX: Anxiety disorder, unspecified: F41.9

## 2020-01-20 NOTE — ED Triage Notes (Signed)
Patient reports she thinks she is having an anxiety attack. Patient reports she felt lightheaded and ate a snickers and it hurts to look into the light and she feels anxious. Patient reports she feels like she cannot stop shaking.

## 2020-01-21 ENCOUNTER — Ambulatory Visit
Admission: EM | Admit: 2020-01-21 | Discharge: 2020-01-21 | Disposition: A | Discharge status: Home / Self Care | Payer: PRIVATE HEALTH INSURANCE | Attending: Emergency Medicine | Admitting: Emergency Medicine

## 2020-01-21 DIAGNOSIS — Z20822 Contact with and (suspected) exposure to covid-19: Secondary | ICD-10-CM

## 2020-01-21 NOTE — Discharge Instructions (Signed)

## 2020-01-21 NOTE — ED Triage Notes (Signed)
Pt requesting covid testing for work. Denies sx's.

## 2020-01-22 LAB — NOVEL CORONAVIRUS, NAA: SARS-CoV-2, NAA: NOT DETECTED

## 2020-01-22 LAB — SARS-COV-2, NAA 2 DAY TAT

## 2020-03-19 ENCOUNTER — Telehealth: Payer: Self-pay | Admitting: Radiology

## 2020-03-19 NOTE — Telephone Encounter (Signed)
Patient called for missed call, no one call from this office, patient stated that she needed an OB/GYN, gave her number to [facility] - closer to her location

## 2020-03-25 ENCOUNTER — Other Ambulatory Visit: Payer: Self-pay

## 2020-03-25 ENCOUNTER — Emergency Department (HOSPITAL_COMMUNITY)
Admission: EM | Admit: 2020-03-25 | Discharge: 2020-03-25 | Disposition: A | Discharge status: Home / Self Care | Payer: PRIVATE HEALTH INSURANCE | Attending: Emergency Medicine | Admitting: Emergency Medicine

## 2020-03-25 DIAGNOSIS — R112 Nausea with vomiting, unspecified: Secondary | ICD-10-CM | POA: Insufficient documentation

## 2020-03-25 DIAGNOSIS — Z3202 Encounter for pregnancy test, result negative: Secondary | ICD-10-CM

## 2020-03-25 DIAGNOSIS — F17210 Nicotine dependence, cigarettes, uncomplicated: Secondary | ICD-10-CM | POA: Insufficient documentation

## 2020-03-25 LAB — I-STAT BETA HCG BLOOD, ED (MC, WL, AP ONLY): I-stat hCG, quantitative: <5 m[IU]/mL (ref <5)

## 2020-03-25 NOTE — ED Notes (Signed)
Pt left before vitals obtained. Seen by APP in lobby.

## 2020-03-25 NOTE — Discharge Instructions (Signed)
Please call your insurance company to find a list of approved OB/GYN providers.  Return to the ER for any new or worsening symptoms.

## 2020-03-25 NOTE — ED Triage Notes (Signed)
Pt here for pregnancy test. Reports missing her December period, having n/v, breast tenderness, sensitivity to certain smells. Multiple negative home pregnancy tests. Went to a hospital in [place] who told her it would be too early to tell in her urine so she needed to come to another hospital and get a hcg test.

## 2020-03-25 NOTE — ED Provider Notes (Signed)
[facility] EMERGENCY DEPARTMENT Provider Note   CSN: [id number] Arrival date & time: 03/25/20  1217     History No chief complaint on file.   Yolanda Barnett is a 25 y.o. female.  HPI 25 year old female here for pregnancy test.  Mr.  In December, having nausea, vomiting, breast tenderness and sensitivity certain smells.  Multiple negative home pregnancy test.  Was told at another hospital that she is too early to tell from a urine pregnancy test and needs to go to another hospital to get a beta-hCG.    Past Medical History:  Diagnosis Date  . Anxiety   . Panic attacks     There are no problems to display for this patient.   No past surgical history on file.   OB History    Gravida  0   Para  0   Term  0   Preterm  0   AB  0   Living  0     SAB  0   IAB  0   Ectopic  0   Multiple  0   Live Births  0           Family History  Problem Relation Age of Onset  . Cancer Mother   . Heart failure Mother     Social History   Tobacco Use  . Smoking status: Current Every Day Smoker    Packs/day: 0.15    Types: Cigarettes  . Smokeless tobacco: Never Used  Vaping Use  . Vaping Use: Never used  Substance Use Topics  . Alcohol use: Yes  . Drug use: Yes    Types: Marijuana    Home Medications Prior to Admission medications   Medication Sig Start Date End Date Taking? Authorizing Provider  benzonatate (TESSALON) 100 MG capsule Take 1 capsule (100 mg total) by mouth every 8 (eight) hours. 10/21/19   Hall-Potvin, [place], PA-C  cetirizine (ZYRTEC ALLERGY) 10 MG tablet Take 1 tablet (10 mg total) by mouth daily. 10/21/19   Hall-Potvin, [place], PA-C  fluticasone (FLONASE) 50 MCG/ACT nasal spray Place 1 spray into both nostrils daily. 10/21/19   Hall-Potvin, [place], PA-C    Allergies    Penicillins and Strawberry flavor  Review of Systems   Review of Systems  Gastrointestinal: Positive for nausea and vomiting.    Physical  Exam Updated Vital Signs BP (!) 137/59 (BP Location: Right Arm)   Pulse 82   Temp 98.7 F (37.1 C) (Oral)   Resp 14   LMP 01/26/2020 (Approximate)   SpO2 100%   Physical Exam Vitals reviewed.  Constitutional:      Appearance: Normal appearance.  HENT:     Head: Normocephalic and atraumatic.  Eyes:     General:        Right eye: No discharge.        Left eye: No discharge.     Extraocular Movements: Extraocular movements intact.     Conjunctiva/sclera: Conjunctivae normal.  Musculoskeletal:        General: No swelling. Normal range of motion.  Neurological:     General: No focal deficit present.     Mental Status: She is alert and oriented to person, place, and time.  Psychiatric:        Mood and Affect: Mood normal.        Behavior: Behavior normal.     ED Results / Procedures / Treatments   Labs (all labs ordered are listed, but only abnormal  results are displayed) Labs Reviewed  I-STAT BETA HCG BLOOD, ED ([facility], [facility], [facility] ONLY)    EKG None  Radiology No results found.  Procedures Procedures (including critical care time)  Medications Ordered in ED Medications - No data to display  ED Course  I have reviewed the triage vital signs and the nursing notes.  Pertinent labs & imaging results that were available during my care of the patient were reviewed by me and considered in my medical decision making (see chart for details).    MDM Rules/Calculators/A&P                          Patient here for pregnancy test.  Overall well-appearing.  Abdomen is soft and nontender.  Low suspicion for intra-abdominal pathology as a cause of her symptoms.  Vitals reassuring.  Reviewed pregnancy test which is negative.  Patient was encouraged to follow-up in OB/GYN.  Return precautions discussed.  She voiced understanding is agreeable Final Clinical Impression(s) / ED Diagnoses Final diagnoses:  Pregnancy examination or test, negative result    Rx / DC Orders ED Discharge  Orders    None       Leone Brand 03/25/20 1403    Mancel Bale, MD 03/26/20 1147

## 2020-11-16 DIAGNOSIS — F17210 Nicotine dependence, cigarettes, uncomplicated: Secondary | ICD-10-CM | POA: Insufficient documentation

## 2020-11-16 DIAGNOSIS — K0889 Other specified disorders of teeth and supporting structures: Secondary | ICD-10-CM

## 2020-11-16 DIAGNOSIS — K029 Dental caries, unspecified: Secondary | ICD-10-CM | POA: Insufficient documentation

## 2020-11-17 ENCOUNTER — Emergency Department (HOSPITAL_BASED_OUTPATIENT_CLINIC_OR_DEPARTMENT_OTHER)
Admission: EM | Admit: 2020-11-17 | Discharge: 2020-11-17 | Disposition: A | Discharge status: Home / Self Care | Payer: Medicaid Other | Attending: Emergency Medicine | Admitting: Emergency Medicine

## 2020-11-17 ENCOUNTER — Encounter (HOSPITAL_BASED_OUTPATIENT_CLINIC_OR_DEPARTMENT_OTHER): Payer: Self-pay | Admitting: Emergency Medicine

## 2020-11-17 ENCOUNTER — Other Ambulatory Visit: Payer: Self-pay

## 2020-11-17 DIAGNOSIS — K0889 Other specified disorders of teeth and supporting structures: Secondary | ICD-10-CM

## 2020-11-17 MED ORDER — CLINDAMYCIN HCL 300 MG PO CAPS: 300.0000 mg | ORAL_CAPSULE | Freq: Three times a day (TID) | ORAL | 0 refills | Status: DC

## 2020-11-17 MED ORDER — CLINDAMYCIN HCL 150 MG PO CAPS
300.0000 mg | ORAL_CAPSULE | Freq: Once | ORAL | Status: AC
  Administered 2020-11-17: 300 mg via ORAL
  Filled 2020-11-17: qty 2

## 2020-11-17 MED ORDER — NAPROXEN 250 MG PO TABS
375.0000 mg | ORAL_TABLET | Freq: Once | ORAL | Status: AC
  Administered 2020-11-17: 375 mg via ORAL
  Filled 2020-11-17: qty 2

## 2020-11-17 MED ORDER — LIDOCAINE VISCOUS HCL 2 % MT SOLN
15.0000 mL | Freq: Once | OROMUCOSAL | Status: AC
  Administered 2020-11-17: 15 mL via OROMUCOSAL
  Filled 2020-11-17: qty 15

## 2020-11-17 MED ORDER — NAPROXEN 375 MG PO TABS: 375.0000 mg | ORAL_TABLET | Freq: Two times a day (BID) | ORAL | 0 refills | Status: DC

## 2020-11-17 NOTE — ED Triage Notes (Signed)
Pt via pov from home with dental pain x 2 months. Pt states she has a dentist but has been unable to afford care. Pt states pain is on both sides. Pt alert & oriented, moaning during triage.

## 2020-11-17 NOTE — ED Provider Notes (Signed)
[facility] EMERGENCY DEPT Provider Note   CSN: [id number] Arrival date & time: 11/16/20  2359     History Chief Complaint  Patient presents with   Dental Pain    Yolanda Barnett is a 25 y.o. female.  The history is provided by the patient.  Dental Pain Location:  Lower Lower teeth location:  18/LL 2nd molar, 19/LL 1st molar, 20/LL 2nd bicuspid, 30/RL 1st molar, 31/RL 2nd molar and 25/RL central incisor Quality:  Aching Severity:  Severe Onset quality:  Gradual Duration:  3 months Timing:  Constant Progression:  Worsening Chronicity:  Chronic Context: poor dentition   Previous work-up:  Dental exam Relieved by:  Nothing Worsened by:  Nothing Ineffective treatments:  Acetaminophen Associated symptoms: no difficulty swallowing, no drooling, no fever, no gum swelling, no neck swelling, no oral bleeding, no oral lesions and no trismus   Risk factors: lack of dental care   Risk factors: no alcohol problem       Past Medical History:  Diagnosis Date   Anxiety    Panic attacks     There are no problems to display for this patient.   History reviewed. No pertinent surgical history.   OB History     Gravida  0   Para  0   Term  0   Preterm  0   AB  0   Living  0      SAB  0   IAB  0   Ectopic  0   Multiple  0   Live Births  0           Family History  Problem Relation Age of Onset   Cancer Mother    Heart failure Mother     Social History   Tobacco Use   Smoking status: Every Day    Packs/day: 0.15    Types: Cigarettes   Smokeless tobacco: Never  Vaping Use   Vaping Use: Never used  Substance Use Topics   Alcohol use: Yes   Drug use: Yes    Types: Marijuana    Home Medications Prior to Admission medications   Medication Sig Start Date End Date Taking? Authorizing Provider  clindamycin (CLEOCIN) 300 MG capsule Take 1 capsule (300 mg total) by mouth 3 (three) times daily. X 7 days 11/17/20  Yes Lorra Freeman, MD   naproxen (NAPROSYN) 375 MG tablet Take 1 tablet (375 mg total) by mouth 2 (two) times daily with a meal. 11/17/20  Yes Terrez Ander, MD  benzonatate (TESSALON) 100 MG capsule Take 1 capsule (100 mg total) by mouth every 8 (eight) hours. 10/21/19   Hall-Potvin, [place], PA-C  cetirizine (ZYRTEC ALLERGY) 10 MG tablet Take 1 tablet (10 mg total) by mouth daily. 10/21/19   Hall-Potvin, [place], PA-C  fluticasone (FLONASE) 50 MCG/ACT nasal spray Place 1 spray into both nostrils daily. 10/21/19   Hall-Potvin, [place], PA-C    Allergies    Penicillins and Strawberry flavor  Review of Systems   Review of Systems  Constitutional:  Negative for fever.  HENT:  Positive for dental problem. Negative for drooling and mouth sores.   Eyes:  Negative for redness.  Respiratory:  Negative for wheezing.   Cardiovascular:  Negative for leg swelling.  Gastrointestinal:  Negative for vomiting.  Musculoskeletal:  Negative for neck stiffness.  Skin:  Negative for rash and wound.  Neurological:  Negative for facial asymmetry.  Psychiatric/Behavioral:  Negative for agitation.   All other systems reviewed and are  negative.  Physical Exam Updated Vital Signs There were no vitals taken for this visit.  Physical Exam Vitals and nursing note reviewed. Exam conducted with a chaperone present.  Constitutional:      General: She is not in acute distress.    Appearance: Normal appearance.  HENT:     Head: Normocephalic and atraumatic.     Nose: Nose normal.     Mouth/Throat:     Dentition: Abnormal dentition. Dental caries present. No gingival swelling or dental abscesses.     Pharynx: No oropharyngeal exudate.  Eyes:     Conjunctiva/sclera: Conjunctivae normal.     Pupils: Pupils are equal, round, and reactive to light.  Cardiovascular:     Rate and Rhythm: Normal rate and regular rhythm.     Pulses: Normal pulses.     Heart sounds: Normal heart sounds.  Pulmonary:     Effort: Pulmonary effort is normal.      Breath sounds: Normal breath sounds.  Abdominal:     General: Abdomen is flat. Bowel sounds are normal.     Palpations: Abdomen is soft.     Tenderness: There is no abdominal tenderness.  Musculoskeletal:        General: Normal range of motion.     Cervical back: Normal range of motion and neck supple.  Skin:    General: Skin is warm and dry.     Capillary Refill: Capillary refill takes less than 2 seconds.  Neurological:     General: No focal deficit present.     Mental Status: She is alert and oriented to person, place, and time.     Deep Tendon Reflexes: Reflexes normal.  Psychiatric:        Mood and Affect: Mood normal.        Behavior: Behavior normal.    ED Results / Procedures / Treatments   Labs (all labs ordered are listed, but only abnormal results are displayed) Labs Reviewed - No data to display  EKG None  Radiology No results found.  Procedures Procedures   Medications Ordered in ED Medications  clindamycin (CLEOCIN) capsule 300 mg (has no administration in time range)  naproxen (NAPROSYN) tablet 375 mg (has no administration in time range)  lidocaine (XYLOCAINE) 2 % viscous mouth solution 15 mL (has no administration in time range)    ED Course  I have reviewed the triage vital signs and the nursing notes.  Pertinent labs & imaging results that were available during my care of the patient were reviewed by me and considered in my medical decision making (see chart for details).   No fever, no trismus.  Poor dentition.  Patient needs care and treatment by a dentist.  Antibiotics and pain medication initiated.  Resource guide printed.     Yolanda Barnett was evaluated in Emergency Department on 11/17/2020 for the symptoms described in the history of present illness. She was evaluated in the context of the global COVID-19 pandemic, which necessitated consideration that the patient might be at risk for infection with the SARS-CoV-2 virus that causes  COVID-19. Institutional protocols and algorithms that pertain to the evaluation of patients at risk for COVID-19 are in a state of rapid change based on information released by regulatory bodies including the CDC and federal and state organizations. These policies and algorithms were followed during the patient's care in the ED.  Final Clinical Impression(s) / ED Diagnoses Final diagnoses:  Pain, dental     Return for intractable cough, coughing  up blood, fevers > 100.4 unrelieved by medication, shortness of breath, intractable vomiting, chest pain, shortness of breath, weakness, numbness, changes in speech, facial asymmetry, abdominal pain, passing out, Inability to tolerate liquids or food, cough, altered mental status or any concerns. No signs of systemic illness or infection. The patient is nontoxic-appearing on exam and vital signs are within normal limits. I have reviewed the triage vital signs and the nursing notes. Pertinent labs & imaging results that were available during my care of the patient were reviewed by me and considered in my medical decision making (see chart for details). After history, exam, and medical workup I feel the patient has been appropriately medically screened and is safe for discharge home. Pertinent diagnoses were discussed with the patient. Patient was given return precautions.   Rx / DC Orders ED Discharge Orders          Ordered    clindamycin (CLEOCIN) 300 MG capsule  3 times daily        11/17/20 0002    naproxen (NAPROSYN) 375 MG tablet  2 times daily with meals        11/17/20 0002             Stacey Sago, MD 11/17/20 0009

## 2021-01-11 ENCOUNTER — Other Ambulatory Visit: Payer: Self-pay

## 2021-01-11 ENCOUNTER — Encounter (HOSPITAL_COMMUNITY): Payer: Self-pay

## 2021-01-11 ENCOUNTER — Emergency Department (HOSPITAL_COMMUNITY)
Admission: EM | Admit: 2021-01-11 | Discharge: 2021-01-11 | Disposition: A | Discharge status: Home / Self Care | Payer: Medicaid Other | Attending: Emergency Medicine | Admitting: Emergency Medicine

## 2021-01-11 DIAGNOSIS — K047 Periapical abscess without sinus: Secondary | ICD-10-CM

## 2021-01-11 DIAGNOSIS — Z87891 Personal history of nicotine dependence: Secondary | ICD-10-CM | POA: Insufficient documentation

## 2021-01-11 DIAGNOSIS — K0889 Other specified disorders of teeth and supporting structures: Secondary | ICD-10-CM | POA: Insufficient documentation

## 2021-01-11 MED ORDER — CLINDAMYCIN HCL 150 MG PO CAPS
150.0000 mg | ORAL_CAPSULE | Freq: Once | ORAL | Status: AC
  Administered 2021-01-11: 150 mg via ORAL
  Filled 2021-01-11: qty 1

## 2021-01-11 MED ORDER — NAPROXEN 375 MG PO TABS: 375.0000 mg | ORAL_TABLET | Freq: Two times a day (BID) | ORAL | 0 refills | Status: AC

## 2021-01-11 MED ORDER — CLINDAMYCIN HCL 300 MG PO CAPS: 300.0000 mg | ORAL_CAPSULE | Freq: Three times a day (TID) | ORAL | 0 refills | Status: AC

## 2021-01-11 MED ORDER — KETOROLAC TROMETHAMINE 30 MG/ML IJ SOLN
30.0000 mg | Freq: Once | INTRAMUSCULAR | Status: AC
  Administered 2021-01-11: 30 mg via INTRAMUSCULAR
  Filled 2021-01-11: qty 1

## 2021-01-11 NOTE — ED Triage Notes (Signed)
Pt reports severe dental pain for a few days.

## 2021-01-11 NOTE — ED Provider Notes (Signed)
[facility]-EMERGENCY DEPT Provider Note   CSN: [id number] Arrival date & time: 01/11/21  1705     History No chief complaint on file.   Yolanda Barnett is a 25 y.o. female.  HPI  Patient presents with dental pain.  This is been going on for multiple months, she was seen in the ED in early September and was started on antibiotics.  She reports she never picked them up.  The pain has been constant in the last few months, worsened acutely today.  She has been trying BC powders and ibuprofen for relief.  Chewing is an aggravating factor.  She has not had any fevers or difficulty swallowing.  She does report she has an associated headache that is worsened when she eats.  Past Medical History:  Diagnosis Date   Anxiety    Panic attacks     There are no problems to display for this patient.   No past surgical history on file.   OB History     Gravida  0   Para  0   Term  0   Preterm  0   AB  0   Living  0      SAB  0   IAB  0   Ectopic  0   Multiple  0   Live Births  0           Family History  Problem Relation Age of Onset   Cancer Mother    Heart failure Mother     Social History   Tobacco Use   Smoking status: Former    Types: Cigarettes   Smokeless tobacco: Never  Vaping Use   Vaping Use: Every day  Substance Use Topics   Alcohol use: Yes    Comment: occasional   Drug use: Yes    Frequency: 3.0 times per week    Types: Marijuana    Home Medications Prior to Admission medications   Medication Sig Start Date End Date Taking? Authorizing Provider  benzonatate (TESSALON) 100 MG capsule Take 1 capsule (100 mg total) by mouth every 8 (eight) hours. 10/21/19   Hall-Potvin, [place], PA-C  cetirizine (ZYRTEC ALLERGY) 10 MG tablet Take 1 tablet (10 mg total) by mouth daily. 10/21/19   Hall-Potvin, [place], PA-C  clindamycin (CLEOCIN) 300 MG capsule Take 1 capsule (300 mg total) by mouth 3 (three) times daily. X 7 days 11/17/20    Palumbo, April, MD  fluticasone [facility]) 50 MCG/ACT nasal spray Place 1 spray into both nostrils daily. 10/21/19   Hall-Potvin, [place], PA-C  naproxen (NAPROSYN) 375 MG tablet Take 1 tablet (375 mg total) by mouth 2 (two) times daily with a meal. 11/17/20   Palumbo, April, MD    Allergies    Penicillins and Strawberry flavor  Review of Systems   Review of Systems  Constitutional:  Negative for fever.  HENT:  Positive for dental problem. Negative for drooling and tinnitus.    Physical Exam Updated Vital Signs There were no vitals taken for this visit.  Physical Exam Vitals and nursing note reviewed. Exam conducted with a chaperone present.  Constitutional:      Appearance: Normal appearance.     Comments: Patient is tearful  HENT:     Head: Normocephalic.     Mouth/Throat:     Mouth: Mucous membranes are moist.     Pharynx: No oropharyngeal exudate or posterior oropharyngeal erythema.     Comments: No sublingual tenderness or swelling. Uvula  is midline. No trismus. No dry sockets or pulp exposure. Handling secretions without difficulty.  Poor dentition overall with palpable fluctuance to the left upper jaw.  No discharge. Eyes:     Extraocular Movements: Extraocular movements intact.     Pupils: Pupils are equal, round, and reactive to light.  Cardiovascular:     Rate and Rhythm: Normal rate and regular rhythm.  Pulmonary:     Effort: Pulmonary effort is normal.     Breath sounds: Normal breath sounds.  Musculoskeletal:     Cervical back: Normal range of motion. No rigidity or tenderness.  Neurological:     Mental Status: She is alert.  Psychiatric:        Mood and Affect: Mood normal.    ED Results / Procedures / Treatments   Labs (all labs ordered are listed, but only abnormal results are displayed) Labs Reviewed - No data to display  EKG None  Radiology No results found.  Procedures Procedures   Medications Ordered in ED Medications  clindamycin (CLEOCIN)  capsule 150 mg (has no administration in time range)  ketorolac (TORADOL) 30 MG/ML injection 30 mg (has no administration in time range)    ED Course  I have reviewed the triage vital signs and the nursing notes.  Pertinent labs & imaging results that were available during my care of the patient were reviewed by me and considered in my medical decision making (see chart for details).    MDM Rules/Calculators/A&P                           Vital signs are stable, patient physical exam findings show abnormal dentition and dental abscess.  No sublingual tenderness, doubt Ludwig angina.  Uvula is midline, no trismus.  No tonsillar exudate-do not believe this is PTA or retropharyngeal abscess.  Will discharge with antibiotics and anti-inflammatory.  Patient was educated on dental resources, advised that she cannot wait until next year when her insurance kicks in to see a dentist.  Reminded that if she does not take the antibiotics is not going to improve.  Advised not to take significant amount of ibuprofen on an empty stomach as this can cause ulcers.  Advised to take all anti-inflammatories with food and water moving forward.  Patient discharged in stable position.  Final Clinical Impression(s) / ED Diagnoses Final diagnoses:  None    Rx / DC Orders ED Discharge Orders     None        Theron Arista, Cordelia Poche 01/11/21 1742    Jacalyn Lefevre, MD 01/11/21 1825

## 2021-01-11 NOTE — Discharge Instructions (Signed)
Take the clindamycin 3 times daily for 7 days as prescribed.  You can take the naproxen twice daily as needed.  Take all anti-inflammatory medicine with food and water moving forward.  You can also take them with Prilosec which is an antacid that will help reduce the chance of getting gastric ulcers.  You need to take the antibiotic for improvement, I given you information for dentist as well as a [employer] school.  Please call both and see if they are able to work with you.
